# Patient Record
Sex: Male | Born: 1965 | Race: White | Hispanic: No | Smoking: Never smoker
Health system: Southern US, Community
[De-identification: ages and names within clinical notes are randomized; demographics above are authoritative.]

## PROBLEM LIST (undated history)

## (undated) DIAGNOSIS — G4730 Sleep apnea, unspecified: Secondary | ICD-10-CM

---

## 2017-09-01 ENCOUNTER — Emergency Department (HOSPITAL_COMMUNITY): Payer: BLUE CROSS/BLUE SHIELD

## 2017-09-01 ENCOUNTER — Other Ambulatory Visit: Payer: Self-pay

## 2017-09-01 ENCOUNTER — Emergency Department (HOSPITAL_COMMUNITY)
Admission: EM | Admit: 2017-09-01 | Discharge: 2017-09-01 | Disposition: A | Discharge status: Home / Self Care | Payer: BLUE CROSS/BLUE SHIELD | Attending: Emergency Medicine | Admitting: Emergency Medicine

## 2017-09-01 ENCOUNTER — Encounter (HOSPITAL_COMMUNITY): Payer: Self-pay | Admitting: Emergency Medicine

## 2017-09-01 DIAGNOSIS — Y999 Unspecified external cause status: Secondary | ICD-10-CM | POA: Insufficient documentation

## 2017-09-01 DIAGNOSIS — S2242XA Multiple fractures of ribs, left side, initial encounter for closed fracture: Secondary | ICD-10-CM | POA: Insufficient documentation

## 2017-09-01 DIAGNOSIS — Y929 Unspecified place or not applicable: Secondary | ICD-10-CM | POA: Insufficient documentation

## 2017-09-01 DIAGNOSIS — Y9389 Activity, other specified: Secondary | ICD-10-CM | POA: Insufficient documentation

## 2017-09-01 DIAGNOSIS — S42144A Nondisplaced fracture of glenoid cavity of scapula, right shoulder, initial encounter for closed fracture: Secondary | ICD-10-CM | POA: Insufficient documentation

## 2017-09-01 DIAGNOSIS — W5532XA Struck by other hoof stock, initial encounter: Secondary | ICD-10-CM | POA: Insufficient documentation

## 2017-09-01 DIAGNOSIS — R1010 Upper abdominal pain, unspecified: Secondary | ICD-10-CM | POA: Insufficient documentation

## 2017-09-01 HISTORY — DX: Sleep apnea, unspecified: G47.30

## 2017-09-01 LAB — COMPREHENSIVE METABOLIC PANEL
ALT: 87 U/L — AB (ref 17–63)
AST: 98 U/L — ABNORMAL HIGH (ref 15–41)
Albumin: 3.8 g/dL (ref 3.5–5.0)
Alkaline Phosphatase: 256 U/L — ABNORMAL HIGH (ref 38–126)
Anion gap: 13 (ref 5–15)
BUN: 8 mg/dL (ref 6–20)
CALCIUM: 9.2 mg/dL (ref 8.9–10.3)
CHLORIDE: 98 mmol/L — AB (ref 101–111)
CO2: 28 mmol/L (ref 22–32)
CREATININE: 1.32 mg/dL — AB (ref 0.61–1.24)
Glucose, Bld: 119 mg/dL — ABNORMAL HIGH (ref 65–99)
Potassium: 3.4 mmol/L — ABNORMAL LOW (ref 3.5–5.1)
Sodium: 139 mmol/L (ref 135–145)
Total Bilirubin: 1.3 mg/dL — ABNORMAL HIGH (ref 0.3–1.2)
Total Protein: 7.7 g/dL (ref 6.5–8.1)

## 2017-09-01 LAB — TYPE AND SCREEN
ABO/RH(D): O POS
ANTIBODY SCREEN: NEGATIVE

## 2017-09-01 LAB — CBC WITH DIFFERENTIAL/PLATELET
ABS IMMATURE GRANULOCYTES: 0.5 10*3/uL — AB (ref 0.0–0.1)
BASOS PCT: 1 %
Basophils Absolute: 0.2 10*3/uL — ABNORMAL HIGH (ref 0.0–0.1)
EOS ABS: 0.2 10*3/uL (ref 0.0–0.7)
EOS PCT: 1 %
HCT: 44.5 % (ref 39.0–52.0)
Hemoglobin: 14.6 g/dL (ref 13.0–17.0)
Immature Granulocytes: 4 %
Lymphocytes Relative: 14 %
Lymphs Abs: 2.1 10*3/uL (ref 0.7–4.0)
MCH: 33.3 pg (ref 26.0–34.0)
MCHC: 32.8 g/dL (ref 30.0–36.0)
MCV: 101.6 fL — AB (ref 78.0–100.0)
MONO ABS: 1.4 10*3/uL — AB (ref 0.1–1.0)
Monocytes Relative: 10 %
Neutro Abs: 10.4 10*3/uL — ABNORMAL HIGH (ref 1.7–7.7)
Neutrophils Relative %: 70 %
PLATELETS: 407 10*3/uL — AB (ref 150–400)
RBC: 4.38 MIL/uL (ref 4.22–5.81)
RDW: 13.3 % (ref 11.5–15.5)
WBC: 14.8 10*3/uL — ABNORMAL HIGH (ref 4.0–10.5)

## 2017-09-01 LAB — PROTIME-INR
INR: 0.98
Prothrombin Time: 12.9 seconds (ref 11.4–15.2)

## 2017-09-01 LAB — ABO/RH: ABO/RH(D): O POS

## 2017-09-01 MED ORDER — OXYCODONE-ACETAMINOPHEN 5-325 MG PO TABS: 1.0000 | ORAL_TABLET | ORAL | 0 refills | Status: AC | PRN

## 2017-09-01 MED ORDER — IOHEXOL 300 MG/ML  SOLN
100.0000 mL | Freq: Once | INTRAMUSCULAR | Status: AC | PRN
  Administered 2017-09-01: 100 mL via INTRAVENOUS

## 2017-09-01 MED ORDER — HYDROMORPHONE HCL 1 MG/ML IJ SOLN
1.0000 mg | Freq: Once | INTRAMUSCULAR | Status: AC
  Administered 2017-09-01: 1 mg via INTRAVENOUS
  Filled 2017-09-01: qty 1

## 2017-09-01 MED ORDER — OXYCODONE-ACETAMINOPHEN 5-325 MG PO TABS
2.0000 | ORAL_TABLET | Freq: Once | ORAL | Status: AC
Start: 2017-09-01 — End: 2017-09-01
  Administered 2017-09-01: 2 via ORAL
  Filled 2017-09-01: qty 2

## 2017-09-01 MED ORDER — IBUPROFEN 800 MG PO TABS: 800.0000 mg | ORAL_TABLET | Freq: Three times a day (TID) | ORAL | 0 refills | Status: AC

## 2017-09-01 NOTE — ED Provider Notes (Signed)
[facility] EMERGENCY DEPARTMENT Provider Note   CSN: [id number] Arrival date & time: 09/01/17  1729     History   Chief Complaint Chief Complaint  Patient presents with  . Chest Injury    HPI Shaun Gibson is a 52 y.o. male.  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 they could not get the bull into the arena, the gait came off its hinges and he fell to the ground, the bull stepped and hit him in the chest with his head, he had no loss of consciousness but did have acute onset of pain to that area, pain is located in the upper abdomen as well as the back.  He was able to walk but with significant difficulty, no pain in the legs, some pain in the right shoulder, was given 150 mcg of fentanyl prehospital by the paramedics he reports that he had a persistent mild tachycardia but no other significant abnormal vital signs.  The patient states his pain is severe, worse with breathing and palpation and movement.  Past Medical History:  Diagnosis Date  . Sleep apnea     There are no active problems to display for this patient.   History reviewed. No pertinent surgical history.      Home Medications    Prior to Admission medications   Medication Sig Start Date End Date Taking? Authorizing Provider  ibuprofen (ADVIL,MOTRIN) 800 MG tablet Take 1 tablet (800 mg total) by mouth 3 (three) times daily. 09/01/17   Eber Hong, MD  oxyCODONE-acetaminophen (PERCOCET) 5-325 MG tablet Take 1 tablet by mouth every 4 (four) hours as needed. 09/01/17   Eber Hong, MD    Family History No family history on file.  Social History Social History   Tobacco Use  . Smoking status: Never Smoker  . Smokeless tobacco: Never Used  Substance Use Topics  . Alcohol use: Not Currently  . Drug use: Never     Allergies   Patient has no allergy information on record.   Review of Systems Review  of Systems  All other systems reviewed and are negative.    Physical Exam Updated Vital Signs BP 133/86   Pulse 97   Temp 98.4 F (36.9 C) (Oral)   Resp (!) 28   Ht 5\' 10"  (1.778 m)   Wt (!) 170.1 kg (375 lb)   SpO2 93%   BMI 53.81 kg/m   Physical Exam  Constitutional: He appears well-developed and well-nourished. No distress.  HENT:  Head: Normocephalic and atraumatic.  Mouth/Throat: Oropharynx is clear and moist. No oropharyngeal exudate.  Eyes: Pupils are equal, round, and reactive to light. Conjunctivae and EOM are normal. Right eye exhibits no discharge. Left eye exhibits no discharge. No scleral icterus.  Neck: Normal range of motion. Neck supple. No JVD present. No thyromegaly present.  Cardiovascular: Normal rate, regular rhythm, normal heart sounds and intact distal pulses. Exam reveals no gallop and no friction rub.  No murmur heard. Pulmonary/Chest: Effort normal and breath sounds normal. No respiratory distress. He has no wheezes. He has no rales. He exhibits tenderness.  No crepitance palpated over the chest wall, no bruising located visually over the chest or abdominal walls  Abdominal: Soft. Bowel sounds are normal. He exhibits no distension and no mass. There is no tenderness.  Musculoskeletal: Normal range of motion. He exhibits tenderness ( Tenderness with range of motion of the  right shoulder, no obvious deformity). He exhibits no edema.  No tenderness over the cervical thoracic or lumbar spines  Lymphadenopathy:    He has no cervical adenopathy.  Neurological: He is alert. Coordination normal.  Skin: Skin is warm and dry. No rash noted. No erythema.  Psychiatric: He has a normal mood and affect. His behavior is normal.  Nursing note and vitals reviewed.    ED Treatments / Results  Labs (all labs ordered are listed, but only abnormal results are displayed) Labs Reviewed  CBC WITH DIFFERENTIAL/PLATELET - Abnormal; Notable for the following components:       Result Value   WBC 14.8 (*)    MCV 101.6 (*)    Platelets 407 (*)    Neutro Abs 10.4 (*)    Monocytes Absolute 1.4 (*)    Basophils Absolute 0.2 (*)    Abs Immature Granulocytes 0.5 (*)    All other components within normal limits  COMPREHENSIVE METABOLIC PANEL - Abnormal; Notable for the following components:   Potassium 3.4 (*)    Chloride 98 (*)    Glucose, Bld 119 (*)    Creatinine, Ser 1.32 (*)    AST 98 (*)    ALT 87 (*)    Alkaline Phosphatase 256 (*)    Total Bilirubin 1.3 (*)    All other components within normal limits  PROTIME-INR  TYPE AND SCREEN  ABO/RH    EKG EKG Interpretation  Date/Time:  Friday September 01 2017 17:31:04 EDT Ventricular Rate:  102 PR Interval:    QRS Duration: 102 QT Interval:  351 QTC Calculation: 458 R Axis:   8 Text Interpretation:  Sinus tachycardia No old tracing to compare Confirmed by Eber Hong ([id number]) on 09/01/2017 5:33:45 PM   Radiology Dg Shoulder Right  Result Date: 09/01/2017 CLINICAL DATA:  Trauma, hit by bowl EXAM: RIGHT SHOULDER - 2+ VIEW COMPARISON:  None. FINDINGS: No fracture or dislocation. Suspected small amount of calcific tendinitis. Moderate degenerative changes at the [facility] joint. Inferior glenohumeral degenerative change. Humeral head appears somewhat high riding. IMPRESSION: 1. No fracture or dislocation 2. High-riding appearance of humeral head, possible rotator cuff abnormality 3. Suspected mild calcific tendinitis 4. Degenerative changes of the [facility] joint and glenohumeral joint. Electronically Signed   By: Jasmine Pang M.D.   On: 09/01/2017 20:45   Ct Chest W Contrast  Result Date: 09/01/2017 CLINICAL DATA:  Charged by a bull and knocked down. Mid chest pain and shoulder pain. EXAM: CT CHEST, ABDOMEN, AND PELVIS WITH CONTRAST TECHNIQUE: Multidetector CT imaging of the chest, abdomen and pelvis was performed following the standard protocol during bolus administration of intravenous contrast. CONTRAST:   OMNIPAQUE IOHEXOL 300 MG/ML  SOLN COMPARISON:  None. FINDINGS: CT CHEST FINDINGS Cardiovascular: The heart is normal in size. No pericardial effusion. The aorta is normal in caliber. No dissection. The branch vessels are patent. Age advanced coronary artery calcifications are noted. Mediastinum/Nodes: Small scattered mediastinal and hilar lymph nodes but no mass or overt adenopathy. No mediastinal hematoma. Lungs/Pleura: No acute pulmonary findings. No pulmonary contusion, pneumothorax or pleural fluid collection. Musculoskeletal: There are nondisplaced fourth, fifth, sixth and seventh left anterior rib fractures. Remote healed left eighth rib fracture. No definite right-sided rib fractures. There appears to be buckling of the anterior cortex of the upper manubrium which could be a fracture. Both clavicles are intact. There is an avulsion type fracture involving the anterior aspect of the glenoid region the scapula and also a fracture along the  inferior aspect of the glenoid. The scapular body is intact. CT ABDOMEN PELVIS FINDINGS Hepatobiliary: No focal hepatic lesions or acute hepatic injury. There are cirrhotic changes involving the liver with a nodular contour, prominent hepatic fissures and increased caudate to labral ratio. No obvious portal venous collaterals. Pancreas: No mass, inflammation or ductal dilatation. Moderate diffuse fatty change. Spleen: Normal size. No acute injury. No perisplenic fluid collections or hematoma. Adrenals/Urinary Tract: The adrenal glands are normal. Bilateral renal calculi but no obstructing ureteral calculi. No acute renal injury. No worrisome renal lesions. Stomach/Bowel: The stomach, duodenum, small bowel and colon are unremarkable. No acute inflammatory changes, mass lesions or obstructive findings. No obvious acute bowel injury. No free air or free fluid. Vascular/Lymphatic: The aorta is normal in caliber. No dissection. The branch vessels are patent. The major venous  structures are patent. No mesenteric or retroperitoneal mass or adenopathy. Small scattered lymph nodes are noted. Reproductive: The prostate gland and seminal vesicles are unremarkable. Other: Slightly prominent pelvic lymph nodes. 12.5 mm medial external iliac lymph node on the right. 10 mm operator lymph node on the left. 8 mm lateral external iliac lymph node on the left. Correlation with PSA level is suggested. Musculoskeletal: Asymmetric right-sided SI joint degenerative changes. The pubic symphysis is intact. No pelvic or hip fractures are identified. The lumbar vertebral bodies are normally aligned. No lumbar spine fracture. IMPRESSION: 1. Fourth through seventh left anterior rib fractures, nondisplaced. 2. Possible buckle fracture involving the anterior cortex of the upper manubrium. No depressed sternal fracture. 3. Right scapular fractures. 4. The heart and great vessels appear normal. No mediastinal hematoma. 5. No acute lung injury. 6. No acute solid organ injury in the abdomen/pelvis and no findings to suggest a bowel injury. 7. Borderline enlarged pelvic lymph nodes. Recommend prostate examination and correlation with PSA level to exclude process such as prostate cancer. 8. Cirrhotic changes involving the liver. 9. Bilateral renal calculi. Electronically Signed   By: Rudie MeyerP.  Gallerani M.D.   On: 09/01/2017 20:49   Ct Abdomen Pelvis W Contrast  Result Date: 09/01/2017 CLINICAL DATA:  Charged by a bull and knocked down. Mid chest pain and shoulder pain. EXAM: CT CHEST, ABDOMEN, AND PELVIS WITH CONTRAST TECHNIQUE: Multidetector CT imaging of the chest, abdomen and pelvis was performed following the standard protocol during bolus administration of intravenous contrast. CONTRAST:  100mL OMNIPAQUE IOHEXOL 300 MG/ML  SOLN COMPARISON:  None. FINDINGS: CT CHEST FINDINGS Cardiovascular: The heart is normal in size. No pericardial effusion. The aorta is normal in caliber. No dissection. The branch vessels are  patent. Age advanced coronary artery calcifications are noted. Mediastinum/Nodes: Small scattered mediastinal and hilar lymph nodes but no mass or overt adenopathy. No mediastinal hematoma. Lungs/Pleura: No acute pulmonary findings. No pulmonary contusion, pneumothorax or pleural fluid collection. Musculoskeletal: There are nondisplaced fourth, fifth, sixth and seventh left anterior rib fractures. Remote healed left eighth rib fracture. No definite right-sided rib fractures. There appears to be buckling of the anterior cortex of the upper manubrium which could be a fracture. Both clavicles are intact. There is an avulsion type fracture involving the anterior aspect of the glenoid region the scapula and also a fracture along the inferior aspect of the glenoid. The scapular body is intact. CT ABDOMEN PELVIS FINDINGS Hepatobiliary: No focal hepatic lesions or acute hepatic injury. There are cirrhotic changes involving the liver with a nodular contour, prominent hepatic fissures and increased caudate to labral ratio. No obvious portal venous collaterals. Pancreas: No mass, inflammation or  ductal dilatation. Moderate diffuse fatty change. Spleen: Normal size. No acute injury. No perisplenic fluid collections or hematoma. Adrenals/Urinary Tract: The adrenal glands are normal. Bilateral renal calculi but no obstructing ureteral calculi. No acute renal injury. No worrisome renal lesions. Stomach/Bowel: The stomach, duodenum, small bowel and colon are unremarkable. No acute inflammatory changes, mass lesions or obstructive findings. No obvious acute bowel injury. No free air or free fluid. Vascular/Lymphatic: The aorta is normal in caliber. No dissection. The branch vessels are patent. The major venous structures are patent. No mesenteric or retroperitoneal mass or adenopathy. Small scattered lymph nodes are noted. Reproductive: The prostate gland and seminal vesicles are unremarkable. Other: Slightly prominent pelvic lymph  nodes. 12.5 mm medial external iliac lymph node on the right. 10 mm operator lymph node on the left. 8 mm lateral external iliac lymph node on the left. Correlation with PSA level is suggested. Musculoskeletal: Asymmetric right-sided SI joint degenerative changes. The pubic symphysis is intact. No pelvic or hip fractures are identified. The lumbar vertebral bodies are normally aligned. No lumbar spine fracture. IMPRESSION: 1. Fourth through seventh left anterior rib fractures, nondisplaced. 2. Possible buckle fracture involving the anterior cortex of the upper manubrium. No depressed sternal fracture. 3. Right scapular fractures. 4. The heart and great vessels appear normal. No mediastinal hematoma. 5. No acute lung injury. 6. No acute solid organ injury in the abdomen/pelvis and no findings to suggest a bowel injury. 7. Borderline enlarged pelvic lymph nodes. Recommend prostate examination and correlation with PSA level to exclude process such as prostate cancer. 8. Cirrhotic changes involving the liver. 9. Bilateral renal calculi. Electronically Signed   By: Rudie Meyer M.D.   On: 09/01/2017 20:49   Dg Chest Port 1 View  Result Date: 09/01/2017 CLINICAL DATA:  52 year old male status post struck by bull. Blunt trauma. Chest and right shoulder pain. EXAM: PORTABLE CHEST 1 VIEW COMPARISON:  None. FINDINGS: Portable AP semi upright view at 1755 hours. Lung volumes are within normal limits. Allowing for portable technique the lungs are clear. No pneumothorax. Mediastinal contours are within normal limits. Visualized tracheal air column is within normal limits. No acute osseous abnormality identified. IMPRESSION: No acute cardiopulmonary abnormality or acute traumatic injury identified. Electronically Signed   By: Odessa Fleming M.D.   On: 09/01/2017 18:15    Procedures Procedures (including critical care time)  Medications Ordered in ED Medications  oxyCODONE-acetaminophen (PERCOCET/ROXICET) 5-325 MG per tablet  2 tablet (has no administration in time range)  HYDROmorphone (DILAUDID) injection 1 mg (1 mg Intravenous Given 09/01/17 1759)  iohexol (OMNIPAQUE) 300 MG/ML solution 100 mL (100 mLs Intravenous Contrast Given 09/01/17 2009)     Initial Impression / Assessment and Plan / ED Course  I have reviewed the triage vital signs and the nursing notes.  Pertinent labs & imaging results that were available during my care of the patient were reviewed by me and considered in my medical decision making (see chart for details).    Exam is limited only and the fact that the patient is morbidly obese and difficult to examine however he does appear to be splinting when he breathes and will require imaging to rule out pneumothorax, rib injury, mediastinal injury and abdominal and solid organ injury.  Pain medication ordered, no need for supplemental oxygen at this time.  IV access will be obtained  Patient was reevaluated at 9:20 PM, he has ongoing pain in his mid chest as well as his right shoulder, this is clearly explained  with his x-ray showing for rib fractures and a scapular fracture.  His oxygen is 96% on room air, he will need an incentive spirometer, adequate pain control with oral medications, he will be given Percocet in a sling and encouraged to follow-up.  He [place] near [facility] and wants to follow-up with a local orthopedist in that area.  I think this is reasonable.  There is no solid or visceral organs injured, there is no other significant injuries to his extremities.  At this time he is stable for discharge with a prescription for both anti-inflammatory and opiate pain medications due to his significant and severe painful condition.  He is agreeable to return should his symptoms worsen including increased pain shortness of breath or cough or fever.    Final Clinical Impressions(s) / ED Diagnoses   Final diagnoses:  Closed fracture of multiple ribs of left side, initial encounter    Closed nondisplaced fracture of glenoid cavity of right scapula, initial encounter    ED Discharge Orders        Ordered    oxyCODONE-acetaminophen (PERCOCET) 5-325 MG tablet  Every 4 hours PRN     09/01/17 2129    ibuprofen (ADVIL,MOTRIN) 800 MG tablet  3 times daily     09/01/17 2129       Eber Hong, MD 09/01/17 2130

## 2017-09-01 NOTE — ED Notes (Addendum)
Pt given discharge instructions/paperwork. Verbalized understanding. Given incentive spirometer, at this time pt has not attempted to demonstrate appropriate use, making phone calls trying to find a ride home. Waiting for ortho tech to apply sling immobilizer.

## 2017-09-01 NOTE — Discharge Instructions (Addendum)
You have multiple rib fractures as well as a fractured shoulder blade.  You will need to wear the sling until you follow-up with an orthopedist, use the incentive spirometer machine that we have given you every hour to help keep your lungs expanded.  Your pain control will likely be difficult given the number of rib fractures, please avoid work for the next week, your family doctor will need to refill your pain medication if you run out within the next week.  Please take ibuprofen first and if that is not enough take oxycodone 5 mg tablets up to 2 tablets every 6 hours as needed but be aware that this can cause constipation so he may want to take a stool softener.  Return to the emergency department for severe or worsening pain, difficulty breathing or fevers

## 2017-09-01 NOTE — Progress Notes (Signed)
Orthopedic Tech Progress Note Patient Details:  Shaun Gibson 02/03/1966 [id number]  Ortho Devices Type of Ortho Device: Shoulder immobilizer Ortho Device/Splint Interventions: Application   Post Interventions Patient Tolerated: Well Instructions Provided: Care of device   Shaun Gibson 09/01/2017, 9:54 PM

## 2017-09-01 NOTE — ED Triage Notes (Signed)
Pt st's a bull ran through the fence knocking him down then the bull head butted him in the chest.  Pt c/o pain in mid upper chest and right shoulder.  EMS gave pt Fentanyl PTA

## 2017-09-01 NOTE — ED Notes (Signed)
Ortho paged for sling immobilizer

## 2017-09-01 NOTE — ED Notes (Signed)
Pt sitting up in chair beside bed.  Pt st's pain only a little better after pain med

## 2019-02-19 IMAGING — CT CT CHEST W/ CM
2 of 5 series · 13 of 36 positions shown, 16 images · IV contrast (APPLIED)
Comparison: None.

CLINICAL DATA: Charged by a bull and knocked down. Mid chest pain
and shoulder pain.

EXAM:
CT CHEST, ABDOMEN, AND PELVIS WITH CONTRAST
TECHNIQUE: Multidetector CT imaging of the chest, abdomen and pelvis was
performed following the standard protocol during bolus
administration of intravenous contrast.
CONTRAST:  100mL OMNIPAQUE IOHEXOL 300 MG/ML  SOLN

[Series 3: cap 5.0 i31f 2 · axial · 0.98mm/px · z∈[+865,+1425]mm · 10 of 138 slices shown, 13 images]
[im 13/138  mediastinal]
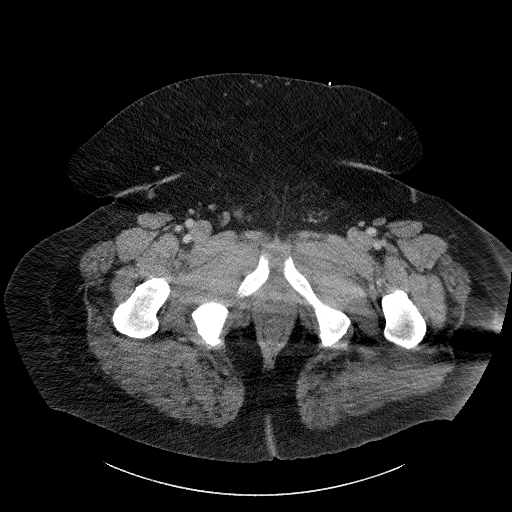
[im 13/138  lung]
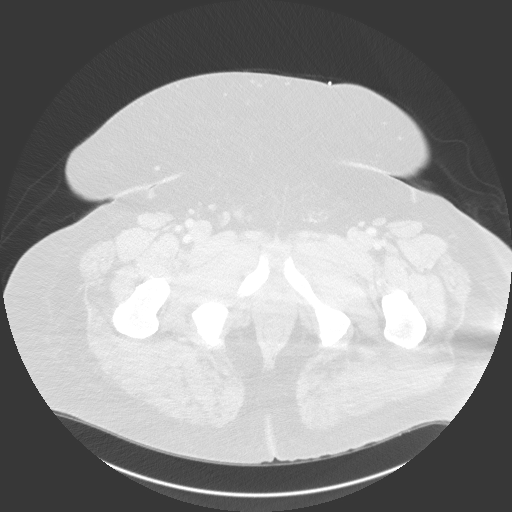
[im 25/138  lung]
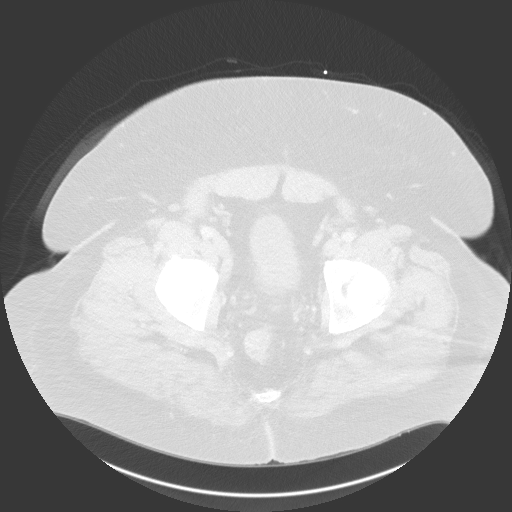
[im 38/138  lung]
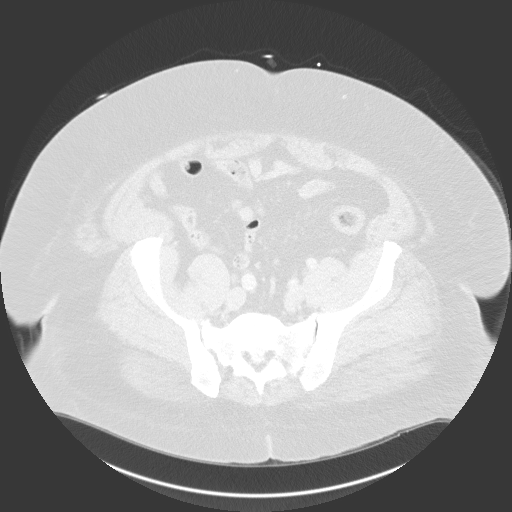
[im 50/138  lung]
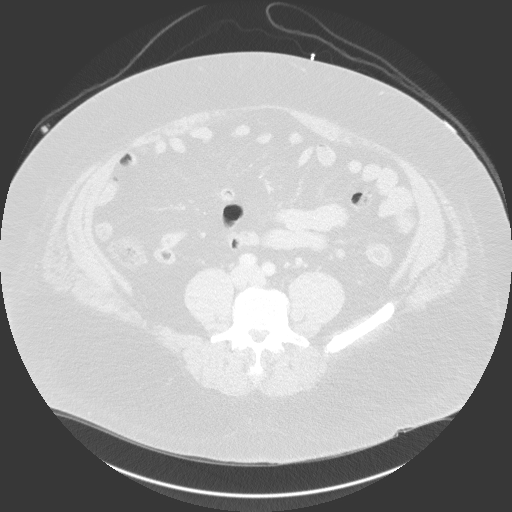
[im 63/138  mediastinal]
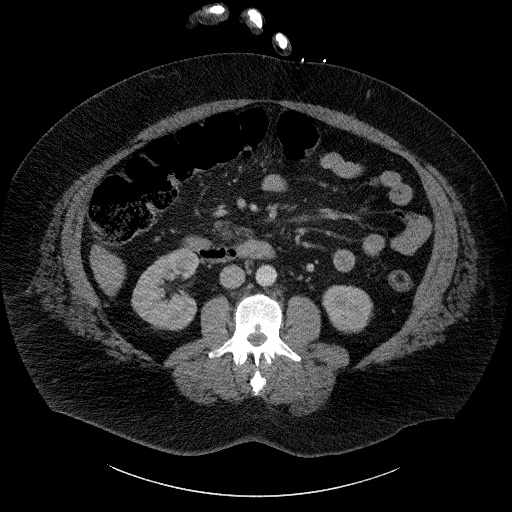
[im 63/138  lung]
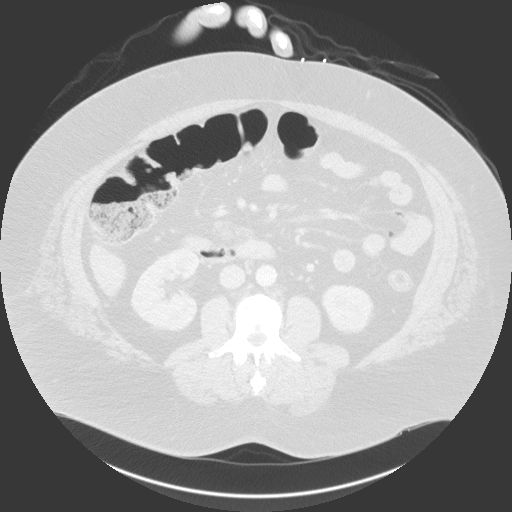
[im 75/138  lung]
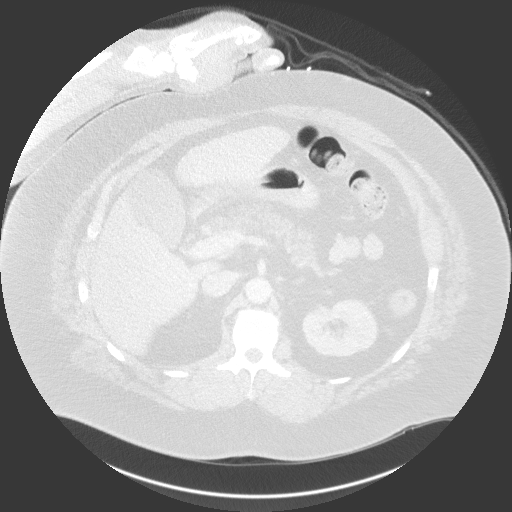
[im 88/138  lung]
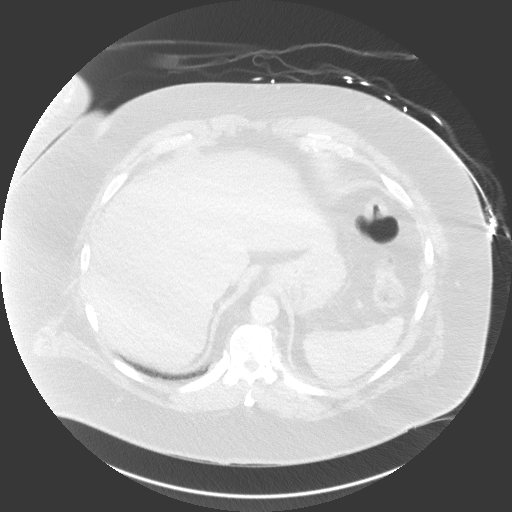
[im 100/138  lung]
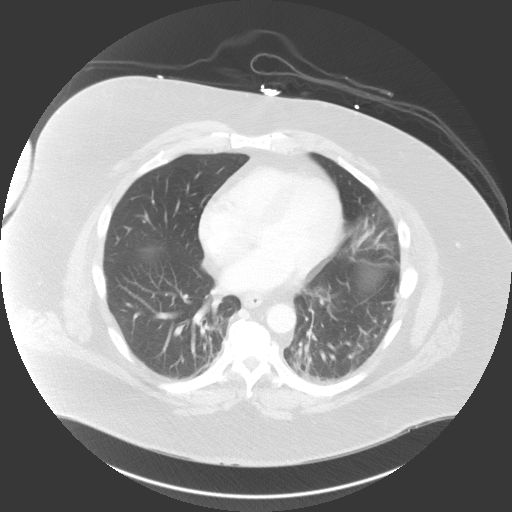
[im 113/138  mediastinal]
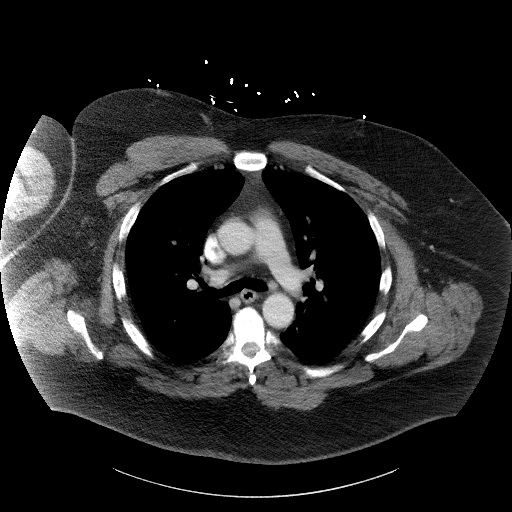
[im 113/138  lung]
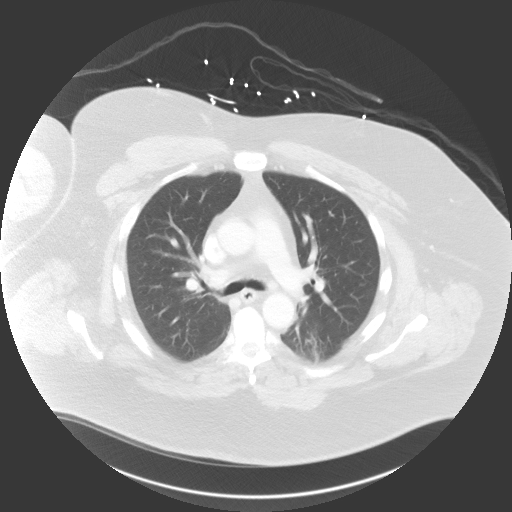
[im 125/138  lung]
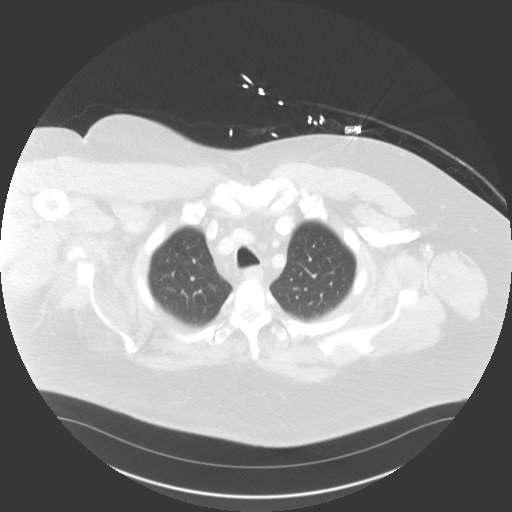

[Series 6: coronal · coronal · 1.03mm/px · 3 of 199 slices shown]
[im 40/199  lung]
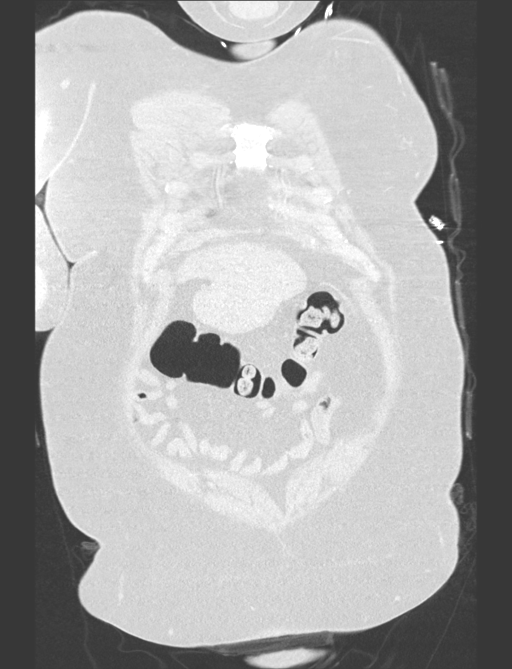
[im 80/199  lung]
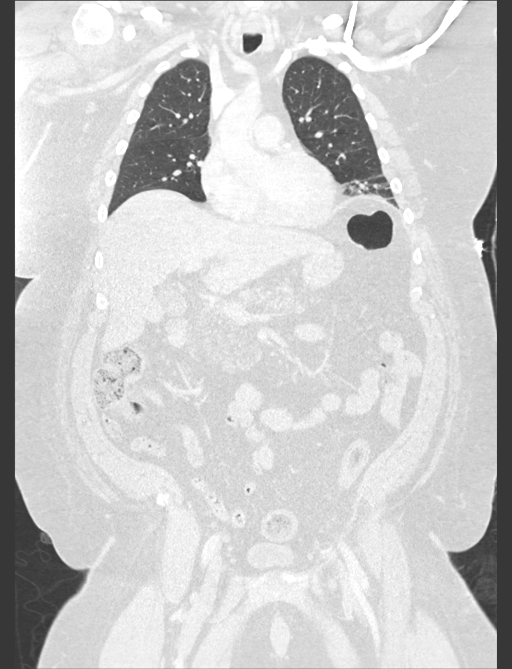
[im 119/199  lung]
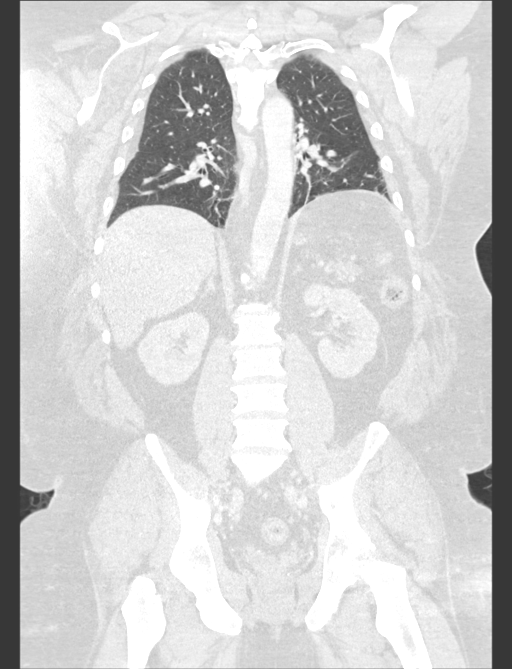

[13 of 36 positions shown; findings below may reference images not displayed]

FINDINGS: CT CHEST FINDINGS

Cardiovascular: The heart is normal in size. No pericardial
effusion. The aorta is normal in caliber. No dissection. The branch
vessels are patent. Age advanced coronary artery calcifications are
noted.

Mediastinum/Nodes: Small scattered mediastinal and hilar lymph nodes
but no mass or overt adenopathy. No mediastinal hematoma.

Lungs/Pleura: No acute pulmonary findings. No pulmonary contusion,
pneumothorax or pleural fluid collection.

Musculoskeletal: There are nondisplaced fourth, fifth, sixth and
seventh left anterior rib fractures. Remote healed left eighth rib
fracture.

No definite right-sided rib fractures.

There appears to be buckling of the anterior cortex of the upper
manubrium which could be a fracture.

Both clavicles are intact.

There is an avulsion type fracture involving the anterior aspect of
the glenoid region the scapula and also a fracture along the
inferior aspect of the glenoid. The scapular body is intact.

CT ABDOMEN PELVIS FINDINGS

Hepatobiliary: No focal hepatic lesions or acute hepatic injury.
There are cirrhotic changes involving the liver with a nodular
contour, prominent hepatic fissures and increased caudate to labral
ratio. No obvious portal venous collaterals.

Pancreas: No mass, inflammation or ductal dilatation. Moderate
diffuse fatty change.

Spleen: Normal size. No acute injury. No perisplenic fluid
collections or hematoma.

Adrenals/Urinary Tract: The adrenal glands are normal.

Bilateral renal calculi but no obstructing ureteral calculi. No
acute renal injury. No worrisome renal lesions.

Stomach/Bowel: The stomach, duodenum, small bowel and colon are
unremarkable. No acute inflammatory changes, mass lesions or
obstructive findings. No obvious acute bowel injury. No free air or
free fluid.

Vascular/Lymphatic: The aorta is normal in caliber. No dissection.
The branch vessels are patent. The major venous structures are
patent. No mesenteric or retroperitoneal mass or adenopathy. Small
scattered lymph nodes are noted.

Reproductive: The prostate gland and seminal vesicles are
unremarkable.

Other: Slightly prominent pelvic lymph nodes. 12.5 mm medial
external iliac lymph node on the right. 10 mm operator lymph node on
the left. 8 mm lateral external iliac lymph node on the left.
Correlation with PSA level is suggested.

Musculoskeletal: Asymmetric right-sided SI joint degenerative
changes. The pubic symphysis is intact. No pelvic or hip fractures
are identified. The lumbar vertebral bodies are normally aligned. No
lumbar spine fracture.
IMPRESSION: 1. Fourth through seventh left anterior rib fractures, nondisplaced.
2. Possible buckle fracture involving the anterior cortex of the
upper manubrium. No depressed sternal fracture.
3. Right scapular fractures.
4. The heart and great vessels appear normal. No mediastinal
hematoma.
5. No acute lung injury.
6. No acute solid organ injury in the abdomen/pelvis and no findings
to suggest a bowel injury.
7. Borderline enlarged pelvic lymph nodes. Recommend prostate
examination and correlation with PSA level to exclude process such
as prostate cancer.
8. Cirrhotic changes involving the liver.
9. Bilateral renal calculi.
